# Patient Record
Sex: Male | Born: 1960 | Race: Black or African American | Hispanic: No | Smoking: Current every day smoker
Health system: Southern US, Community
[De-identification: ages and names within clinical notes are randomized; demographics above are authoritative.]

## PROBLEM LIST (undated history)

## (undated) DIAGNOSIS — E785 Hyperlipidemia, unspecified: Secondary | ICD-10-CM

## (undated) DIAGNOSIS — E119 Type 2 diabetes mellitus without complications: Secondary | ICD-10-CM

## (undated) DIAGNOSIS — I10 Essential (primary) hypertension: Secondary | ICD-10-CM

## (undated) HISTORY — DX: Hyperlipidemia, unspecified: E78.5

## (undated) HISTORY — DX: Type 2 diabetes mellitus without complications: E11.9

## (undated) HISTORY — DX: Essential (primary) hypertension: I10

---

## 1998-04-16 ENCOUNTER — Encounter: Admission: RE | Admit: 1998-04-16 | Discharge: 1998-04-16 | Payer: Self-pay | Admitting: Family Medicine

## 1998-05-07 ENCOUNTER — Encounter: Admission: RE | Admit: 1998-05-07 | Discharge: 1998-05-07 | Payer: Self-pay | Admitting: Sports Medicine

## 2004-08-21 ENCOUNTER — Emergency Department (HOSPITAL_COMMUNITY): Admission: EM | Admit: 2004-08-21 | Discharge: 2004-08-21 | Payer: Self-pay | Admitting: Emergency Medicine

## 2012-12-13 ENCOUNTER — Encounter: Payer: BC Managed Care – PPO | Attending: Internal Medicine | Admitting: *Deleted

## 2012-12-13 DIAGNOSIS — Z713 Dietary counseling and surveillance: Secondary | ICD-10-CM | POA: Insufficient documentation

## 2012-12-13 DIAGNOSIS — E119 Type 2 diabetes mellitus without complications: Secondary | ICD-10-CM | POA: Insufficient documentation

## 2012-12-14 ENCOUNTER — Encounter: Payer: Self-pay | Admitting: *Deleted

## 2012-12-14 NOTE — Progress Notes (Signed)
  Patient was seen on 12/13/2012 for the first of a series of three diabetes self-management courses at the [facility].  A1c on 12/06/2012 = 9.4% The following learning objectives were met by the patient during this course:   Defines the role of glucose and insulin  Identifies type of diabetes and pathophysiology  Defines the diagnostic criteria for diabetes and prediabetes  States the risk factors for Type 2 Diabetes  States the symptoms of Type 2 Diabetes  Defines Type 2 Diabetes treatment goals  Defines Type 2 Diabetes treatment options  States the rationale for glucose monitoring  Identifies A1C, glucose targets, and testing times  Identifies proper sharps disposal  Defines the purpose of a diabetes food plan  Identifies carbohydrate food groups  Defines effects of carbohydrate foods on glucose levels  Identifies carbohydrate choices/grams/food labels  States benefits of physical activity and effect on glucose  Review of suggested activity guidelines  Handouts given during class include:  Type 2 Diabetes: Basics Book  My Food Plan Book  Food and Activity Log  Follow-Up Plan: Core Class 2

## 2012-12-14 NOTE — Patient Instructions (Signed)
Goals:  Follow Diabetes Meal Plan as instructed  Eat 3 meals and 2 snacks, every 3-5 hrs  Limit carbohydrate intake to 60 grams carbohydrate/meal  Limit carbohydrate intake to 0-30 grams carbohydrate/snack  Add lean protein foods to meals/snacks  Monitor glucose levels as instructed by your doctor  Aim for 15 mins of physical activity daily as tolerated  Bring food record and glucose log to your next nutrition visit

## 2013-01-05 ENCOUNTER — Encounter: Payer: PRIVATE HEALTH INSURANCE | Attending: Internal Medicine | Admitting: Dietician

## 2013-01-05 DIAGNOSIS — E119 Type 2 diabetes mellitus without complications: Secondary | ICD-10-CM | POA: Insufficient documentation

## 2013-01-05 DIAGNOSIS — Z713 Dietary counseling and surveillance: Secondary | ICD-10-CM | POA: Insufficient documentation

## 2013-01-05 NOTE — Progress Notes (Signed)
  Patient was seen on 01/05/09 for the second of a series of three diabetes self-management courses at the [facility]. The following learning objectives were met by the patient during this course:   Explain basic nutrition maintenance and quality assurance  Describe causes, symptoms and treatment of hypoglycemia and hyperglycemia  Explain how to manage diabetes during illness  Describe the importance of good nutrition for health and healthy eating strategies  List strategies to follow meal plan when dining out  Describe the effects of alcohol on glucose and how to use it safely  Describe problem solving skills for day-to-day glucose challenges  Describe strategies to use when treatment plan needs to change  Identify important factors involved in successful weight loss  Describe ways to remain physically active  Describe the impact of regular activity on insulin resistance  Handouts given in class:  Refrigerator magnet for Sick Day Guidelines  [facility] Oral medication/insulin handout  Weight Loss Handout  Follow-Up Plan: Patient will attend the final class of the ADA Diabetes Self-Care Education.

## 2013-02-16 ENCOUNTER — Encounter: Payer: PRIVATE HEALTH INSURANCE | Attending: Internal Medicine | Admitting: *Deleted

## 2013-02-16 DIAGNOSIS — Z713 Dietary counseling and surveillance: Secondary | ICD-10-CM | POA: Insufficient documentation

## 2013-02-16 DIAGNOSIS — E119 Type 2 diabetes mellitus without complications: Secondary | ICD-10-CM | POA: Insufficient documentation

## 2013-02-16 NOTE — Progress Notes (Signed)
  Patient was seen on 02/16/13 for the third of a series of three diabetes self-management courses at the [facility]. The following learning objectives were met by the patient during this course:    Describe how diabetes changes over time   Identify diabetes complications and ways to prevent them   Describe strategies that can promote heart health including lowering blood pressure and cholesterol   Describe strategies to lower dietary fat and sodium in the diet   Identify physical activities that benefit cardiovascular health   Evaluate success in meeting personal goal   Describe the belief that they can live successfully with diabetes day to day   Establish 2-3 goals that they will plan to diligently work on until they return for the free 69-month follow-up visit  The following handouts were given in class:  3 Month Follow Up Visit handout  Goal setting handout  Class evaluation form  Your patient has established the following 3 month goals for diabetes self-care:  Increase my activity by taking the stairs one day a week  Look for patterns in my record book 1 day a month  Stop smoking by 2015  Follow-Up Plan: Patient will attend a 3 month follow-up visit for diabetes self-management education.

## 2013-02-16 NOTE — Patient Instructions (Signed)
Goals:  Follow Diabetes Meal Plan as instructed  Eat 3 meals and 2 snacks, every 3-5 hrs  Limit carbohydrate intake to 60 grams carbohydrate/meal  Limit carbohydrate intake to 30 grams carbohydrate/snack  Add lean protein foods to meals/snacks  Monitor glucose levels as instructed by your doctor  Aim for 30 mins of physical activity daily  Bring food record and glucose log to your next nutrition visit   

## 2013-04-10 ENCOUNTER — Ambulatory Visit
Admission: RE | Admit: 2013-04-10 | Discharge: 2013-04-10 | Disposition: A | Discharge status: Home / Self Care | Payer: No Typology Code available for payment source | Source: Ambulatory Visit | Attending: Infectious Diseases | Admitting: Infectious Diseases

## 2013-04-10 ENCOUNTER — Other Ambulatory Visit: Payer: Self-pay | Admitting: Infectious Diseases

## 2013-04-10 DIAGNOSIS — R7611 Nonspecific reaction to tuberculin skin test without active tuberculosis: Secondary | ICD-10-CM

## 2017-10-18 ENCOUNTER — Encounter (HOSPITAL_COMMUNITY): Payer: Self-pay | Admitting: Emergency Medicine

## 2017-10-18 ENCOUNTER — Emergency Department (HOSPITAL_COMMUNITY): Payer: 59

## 2017-10-18 ENCOUNTER — Inpatient Hospital Stay (HOSPITAL_COMMUNITY)
Admission: EM | Admit: 2017-10-18 | Discharge: 2017-10-21 | DRG: 054 | Disposition: A | Discharge status: Hospice | Payer: 59 | Attending: Neurosurgery | Admitting: Neurosurgery

## 2017-10-18 ENCOUNTER — Inpatient Hospital Stay (HOSPITAL_COMMUNITY): Payer: 59

## 2017-10-18 DIAGNOSIS — Z7984 Long term (current) use of oral hypoglycemic drugs: Secondary | ICD-10-CM

## 2017-10-18 DIAGNOSIS — F17200 Nicotine dependence, unspecified, uncomplicated: Secondary | ICD-10-CM | POA: Diagnosis present

## 2017-10-18 DIAGNOSIS — G9389 Other specified disorders of brain: Secondary | ICD-10-CM

## 2017-10-18 DIAGNOSIS — E785 Hyperlipidemia, unspecified: Secondary | ICD-10-CM | POA: Diagnosis present

## 2017-10-18 DIAGNOSIS — D496 Neoplasm of unspecified behavior of brain: Secondary | ICD-10-CM | POA: Diagnosis present

## 2017-10-18 DIAGNOSIS — Z79899 Other long term (current) drug therapy: Secondary | ICD-10-CM | POA: Diagnosis not present

## 2017-10-18 DIAGNOSIS — G936 Cerebral edema: Secondary | ICD-10-CM | POA: Diagnosis present

## 2017-10-18 DIAGNOSIS — I10 Essential (primary) hypertension: Secondary | ICD-10-CM | POA: Diagnosis present

## 2017-10-18 DIAGNOSIS — E119 Type 2 diabetes mellitus without complications: Secondary | ICD-10-CM | POA: Diagnosis present

## 2017-10-18 DIAGNOSIS — C718 Malignant neoplasm of overlapping sites of brain: Principal | ICD-10-CM | POA: Diagnosis present

## 2017-10-18 DIAGNOSIS — R414 Neurologic neglect syndrome: Secondary | ICD-10-CM | POA: Diagnosis present

## 2017-10-18 LAB — URINALYSIS, ROUTINE W REFLEX MICROSCOPIC
BACTERIA UA: NONE SEEN
BILIRUBIN URINE: NEGATIVE
Glucose, UA: 150 mg/dL — AB
HGB URINE DIPSTICK: NEGATIVE
KETONES UR: NEGATIVE mg/dL
LEUKOCYTES UA: NEGATIVE
NITRITE: NEGATIVE
Protein, ur: 30 mg/dL — AB
SPECIFIC GRAVITY, URINE: 1.015 (ref 1.005–1.030)
SQUAMOUS EPITHELIAL / LPF: NONE SEEN
pH: 7 (ref 5.0–8.0)

## 2017-10-18 LAB — GLUCOSE, CAPILLARY
GLUCOSE-CAPILLARY: 197 mg/dL — AB (ref 65–99)
Glucose-Capillary: 204 mg/dL — ABNORMAL HIGH (ref 65–99)

## 2017-10-18 LAB — CBC WITH DIFFERENTIAL/PLATELET
Basophils Absolute: 0 10*3/uL (ref 0.0–0.1)
Basophils Relative: 0 %
Eosinophils Absolute: 0 10*3/uL (ref 0.0–0.7)
Eosinophils Relative: 0 %
HCT: 44 % (ref 39.0–52.0)
Hemoglobin: 14.4 g/dL (ref 13.0–17.0)
Lymphocytes Relative: 16 %
Lymphs Abs: 1.8 10*3/uL (ref 0.7–4.0)
MCH: 27 pg (ref 26.0–34.0)
MCHC: 32.7 g/dL (ref 30.0–36.0)
MCV: 82.6 fL (ref 78.0–100.0)
Monocytes Absolute: 0.8 10*3/uL (ref 0.1–1.0)
Monocytes Relative: 7 %
Neutro Abs: 8.5 10*3/uL — ABNORMAL HIGH (ref 1.7–7.7)
Neutrophils Relative %: 77 %
Platelets: 336 10*3/uL (ref 150–400)
RBC: 5.33 MIL/uL (ref 4.22–5.81)
RDW: 14.5 % (ref 11.5–15.5)
WBC: 11.1 10*3/uL — ABNORMAL HIGH (ref 4.0–10.5)

## 2017-10-18 LAB — BASIC METABOLIC PANEL
Anion gap: 8 (ref 5–15)
BUN: 6 mg/dL (ref 6–20)
CALCIUM: 9.2 mg/dL (ref 8.9–10.3)
CHLORIDE: 96 mmol/L — AB (ref 101–111)
CO2: 30 mmol/L (ref 22–32)
CREATININE: 0.76 mg/dL (ref 0.61–1.24)
GFR calc Af Amer: >60 mL/min (ref >60)
GFR calc non Af Amer: >60 mL/min (ref >60)
Glucose, Bld: 203 mg/dL — ABNORMAL HIGH (ref 65–99)
Potassium: 4.8 mmol/L (ref 3.5–5.1)
SODIUM: 134 mmol/L — AB (ref 135–145)

## 2017-10-18 LAB — TROPONIN I: Troponin I: <0.03 ng/mL

## 2017-10-18 LAB — AMMONIA: AMMONIA: 16 umol/L (ref 9–35)

## 2017-10-18 MED ORDER — GADOBENATE DIMEGLUMINE 529 MG/ML IV SOLN
20.0000 mL | Freq: Once | INTRAVENOUS | Status: AC
  Administered 2017-10-18: 20 mL via INTRAVENOUS

## 2017-10-18 MED ORDER — FOLIC ACID 1 MG PO TABS
1.0000 mg | ORAL_TABLET | Freq: Every day | ORAL | Status: DC
  Administered 2017-10-19 – 2017-10-21 (×3): 1 mg via ORAL
  Filled 2017-10-18 (×3): qty 1

## 2017-10-18 MED ORDER — IOPAMIDOL (ISOVUE-300) INJECTION 61%
INTRAVENOUS | Status: AC
  Administered 2017-10-18: 100 mL
  Filled 2017-10-18: qty 100

## 2017-10-18 MED ORDER — LOSARTAN POTASSIUM 50 MG PO TABS
100.0000 mg | ORAL_TABLET | Freq: Every day | ORAL | Status: DC
  Administered 2017-10-19 – 2017-10-21 (×3): 100 mg via ORAL
  Filled 2017-10-18 (×3): qty 2

## 2017-10-18 MED ORDER — INSULIN ASPART 100 UNIT/ML ~~LOC~~ SOLN
0.0000 [IU] | Freq: Three times a day (TID) | SUBCUTANEOUS | Status: DC
  Administered 2017-10-19: 2 [IU] via SUBCUTANEOUS
  Administered 2017-10-19: 3 [IU] via SUBCUTANEOUS
  Administered 2017-10-19: 2 [IU] via SUBCUTANEOUS
  Administered 2017-10-20: 3 [IU] via SUBCUTANEOUS
  Administered 2017-10-20 (×2): 11 [IU] via SUBCUTANEOUS
  Administered 2017-10-21 (×3): 8 [IU] via SUBCUTANEOUS

## 2017-10-18 MED ORDER — LOSARTAN POTASSIUM-HCTZ 100-12.5 MG PO TABS
1.0000 | ORAL_TABLET | Freq: Every day | ORAL | Status: DC
Start: 2017-10-18 — End: 2017-10-18

## 2017-10-18 MED ORDER — ADULT MULTIVITAMIN W/MINERALS CH
1.0000 | ORAL_TABLET | Freq: Every day | ORAL | Status: DC
  Administered 2017-10-19 – 2017-10-21 (×3): 1 via ORAL
  Filled 2017-10-18 (×3): qty 1

## 2017-10-18 MED ORDER — IOPAMIDOL (ISOVUE-300) INJECTION 61%
INTRAVENOUS | Status: AC
  Administered 2017-10-18: 21:00:00
  Filled 2017-10-18: qty 30

## 2017-10-18 MED ORDER — DEXAMETHASONE SODIUM PHOSPHATE 10 MG/ML IJ SOLN
10.0000 mg | Freq: Once | INTRAMUSCULAR | Status: AC
  Administered 2017-10-18: 10 mg via INTRAVENOUS
  Filled 2017-10-18: qty 1

## 2017-10-18 MED ORDER — HYDROCHLOROTHIAZIDE 12.5 MG PO CAPS
12.5000 mg | ORAL_CAPSULE | Freq: Every day | ORAL | Status: DC
  Administered 2017-10-19 – 2017-10-21 (×3): 12.5 mg via ORAL
  Filled 2017-10-18 (×3): qty 1

## 2017-10-18 MED ORDER — SODIUM CHLORIDE 0.9% FLUSH
3.0000 mL | Freq: Two times a day (BID) | INTRAVENOUS | Status: DC
  Administered 2017-10-18 – 2017-10-21 (×6): 3 mL via INTRAVENOUS

## 2017-10-18 MED ORDER — METFORMIN HCL 500 MG PO TABS
500.0000 mg | ORAL_TABLET | Freq: Two times a day (BID) | ORAL | Status: DC
  Administered 2017-10-19 – 2017-10-21 (×6): 500 mg via ORAL
  Filled 2017-10-18 (×6): qty 1

## 2017-10-18 MED ORDER — PRAVASTATIN SODIUM 40 MG PO TABS
80.0000 mg | ORAL_TABLET | Freq: Every day | ORAL | Status: DC
  Administered 2017-10-19 – 2017-10-21 (×3): 80 mg via ORAL
  Filled 2017-10-18 (×3): qty 2

## 2017-10-18 NOTE — ED Triage Notes (Signed)
H/a started last week , feels weaker ,  Daughter states that pts left side is not right and speech was slurred but now it is better  Noticed all this am , pt states fell this am  After getting dizzy

## 2017-10-18 NOTE — H&P (Signed)
Stephen House is an 56 y.o. male.   Chief Complaint: headache HPI: 56 year old male with 1 week history of feeling bad. Patient notes headache and increasing difficulty with his left side. Patient has noted some visual difficulty.Not aware of any definite weakness.Patient with history of tobacco abuse. No known history of malignancy.no history of seizure.  Past Medical History:  Diagnosis Date  . Diabetes mellitus without complication ([place])   . Hyperlipidemia   . Hypertension     History reviewed. No pertinent surgical history.  No family history on file. Social History:  reports that he has been smoking.  He has never used smokeless tobacco. He reports that he does not drink alcohol. His drug history is not on file.  Allergies: No Known Allergies   (Not in a hospital admission)  Results for orders placed or performed during the hospital encounter of 10/18/17 (from the past 48 hour(s))  Ammonia     Status: None   Collection Time: 10/18/17 12:43 PM  Result Value Ref Range   Ammonia 16 9 - 35 umol/L  Basic metabolic panel     Status: Abnormal   Collection Time: 10/18/17 12:52 PM  Result Value Ref Range   Sodium 134 (L) 135 - 145 mmol/L   Potassium 4.8 3.5 - 5.1 mmol/L   Chloride 96 (L) 101 - 111 mmol/L   CO2 30 22 - 32 mmol/L   Glucose, Bld 203 (H) 65 - 99 mg/dL   BUN 6 6 - 20 mg/dL   Creatinine, Ser 0.76 0.61 - 1.24 mg/dL   Calcium 9.2 8.9 - 10.3 mg/dL   GFR calc non Af Amer >60 >60 mL/min   GFR calc Af Amer >60 >60 mL/min    Comment: (NOTE) The eGFR has been calculated using the CKD EPI equation. This calculation has not been validated in all clinical situations. eGFR's persistently <60 mL/min signify possible Chronic Kidney Disease.    Anion gap 8 5 - 15  Troponin I     Status: None   Collection Time: 10/18/17 12:52 PM  Result Value Ref Range   Troponin I <0.03 <0.03 ng/mL  CBC with Differential     Status: Abnormal   Collection Time: 10/18/17 12:52 PM   Result Value Ref Range   WBC 11.1 (H) 4.0 - 10.5 K/uL   RBC 5.33 4.22 - 5.81 MIL/uL   Hemoglobin 14.4 13.0 - 17.0 g/dL   HCT 44.0 39.0 - 52.0 %   MCV 82.6 78.0 - 100.0 fL   MCH 27.0 26.0 - 34.0 pg   MCHC 32.7 30.0 - 36.0 g/dL   RDW 14.5 11.5 - 15.5 %   Platelets 336 150 - 400 K/uL   Neutrophils Relative % 77 %   Neutro Abs 8.5 (H) 1.7 - 7.7 K/uL   Lymphocytes Relative 16 %   Lymphs Abs 1.8 0.7 - 4.0 K/uL   Monocytes Relative 7 %   Monocytes Absolute 0.8 0.1 - 1.0 K/uL   Eosinophils Relative 0 %   Eosinophils Absolute 0.0 0.0 - 0.7 K/uL   Basophils Relative 0 %   Basophils Absolute 0.0 0.0 - 0.1 K/uL   Dg Chest 2 View  Result Date: 10/18/2017 CLINICAL DATA:  Confusion and headache for 3 days. EXAM: CHEST  2 VIEW COMPARISON:  Single-view of the chest 04/10/2013. FINDINGS: The lungs are clear. Heart size is normal. There is no pneumothorax or pleural effusion. No acute bony abnormality. IMPRESSION: Negative chest. Electronically Signed   By: Inge Rise  M.D.   On: 10/18/2017 13:37   Ct Head Wo Contrast  Result Date: 10/18/2017 CLINICAL DATA:  Headache for several days.  Dizziness. EXAM: CT HEAD WITHOUT CONTRAST TECHNIQUE: Contiguous axial images were obtained from the base of the skull through the vertex without intravenous contrast. COMPARISON:  None. FINDINGS: Brain: There is a mass arising in the right occipital low measuring 1.3 x 1.3 cm on this noncontrast enhanced study. This area shows diffuse increased attenuation, suggesting likely hypercellularity. There is widespread vasogenic edema throughout the right occipital and posterior temporal lobes. There is effacement of the occipital horn of the right lateral ventricle. There is shift of the lateral ventricles to the left of midline by 1.5 cm. There is effacement of the left lateral ventricle due to this midline shift and mass effect. There is no evidence suggesting acute infarct. There is no subdural or epidural fluid  collection. No acute hemorrhage evident. Vascular: No appreciable hyperdense vessel. There is mild calcification in the carotid siphon. Skull: The bony calvarium appears intact. Sinuses/Orbits: There is opacification in the posterior right ethmoid air cell complex region in the anterior left ethmoid air cell complex region. There are several areas of ethmoid sinus mucosal thickening elsewhere. There is mild mucosal thickening in the inferolateral left maxillary antrum. Other visualized paranasal sinuses clear. Orbits appear symmetric bilaterally. Other: Mastoid air cells are clear. IMPRESSION: 1. Mass arising in right occipital lobe with extensive vasogenic edema. This vasogenic edema is causing extensive localized mass effect. There is shift of the lateral ventricles 1.5 cm to the left with effacement of the left lateral ventricle. Elsewhere gray-Brigante compartments appear normal on this noncontrast enhanced study. No acute infarct. No acute hemorrhage evident. Increased attenuation in the area of apparent mass is likely due to hypercellularity. MR pre and post-contrast potentially could be helpful for further assessment with respect to degree of mass versus edema and potentially other smaller lesions elsewhere. 2.  Mild arterial vascular calcification. 3.  Foci of paranasal sinus disease noted. These results were called by telephone at the time of interpretation on 10/18/2017 at 1:22 pm to Dr. Zenovia Jarred , who verbally acknowledged these results. Electronically Signed   By: Lowella Grip III M.D.   On: 10/18/2017 13:22    Pertinent items noted in HPI and remainder of comprehensive ROS otherwise negative.  Blood pressure (!) 167/87, pulse 75, resp. rate (!) 23, SpO2 100 %.  The patient is awake and aware. He is oriented to person and time and place. His speech is fluent but slow. Moderate hemi-neglect on left. Cranial nerve function with left-sided visual field deficit.patient with slight left  facial weakness.motor examination 5/5 bilaterally with the aforementioned neglect.Sensory examination with decrease stereognosis. Examination of HEENT unremarkable. Neck with midline airway without masses.Chest and abdomen benign. Extremities free from injury or deformity. 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 Marked vasogenic edema. Plan admission to hospital with administration of IV steroids. Plan MRI scan brain and CT scan chest and abdomen for further evaluation.  Roddrick Sharron A 10/18/2017, 2:19 PM

## 2017-10-18 NOTE — ED Provider Notes (Signed)
[facility] EMERGENCY DEPARTMENT Provider Note   CSN: [id number] Arrival date & time: 10/18/17  1125     History   Chief Complaint Chief Complaint  Patient presents with  . Headache    HPI Stephen House is a 56 y.o. male.  HPI   56 year old male with PMH of HTN, hyperlipidemia, and T2DM presents with headaches x2 weeks. Headache began gradually and has been low grade. Present over right temporal region. Has noted intermittent slurred speech x1 week as well as left sided weakness. Daughter at bedside reports her father called her at 10:30 this morning requesting to go to the hospital. When she arrived, he seemed confused and was slurring his words. With standing she noticed that he would lean to the left side. No prior history of CVA or MI. Not on anticoagulation. Reports he does not take statin medication or baby ASA.   Past Medical History:  Diagnosis Date  . Diabetes mellitus without complication ([place])   . Hyperlipidemia   . Hypertension     There are no active problems to display for this patient.   History reviewed. No pertinent surgical history.     Home Medications    Prior to Admission medications   Medication Sig Start Date End Date Taking? Authorizing Provider  metFORMIN (GLUCOPHAGE) 500 MG tablet Take 500 mg by mouth 2 (two) times daily with a meal.   Yes [provider]  losartan-hydrochlorothiazide (HYZAAR) 100-12.5 MG per tablet Take 1 tablet by mouth daily.    [provider]  ONE TOUCH LANCETS MISC by Does not apply route.    [provider]  pravastatin (PRAVACHOL) 80 MG tablet Take 80 mg by mouth daily.    [provider]    Family History No family history on file.  Social History Social History  Substance Use Topics  . Smoking status: Current Every Day Smoker  . Smokeless tobacco: Never Used  . Alcohol use No     Allergies   Patient has no known allergies.   Review of  Systems Review of Systems  Constitutional: Negative for chills and fever.  HENT: Negative for congestion and trouble swallowing.   Eyes: Negative for visual disturbance.  Respiratory: Negative for chest tightness and shortness of breath.   Cardiovascular: Negative for chest pain.  Gastrointestinal: Negative for abdominal pain, nausea and vomiting.  Neurological: Positive for speech difficulty, weakness and headaches. Negative for facial asymmetry and numbness.  All other systems reviewed and are negative.    Physical Exam Updated Vital Signs BP (!) 167/87   Pulse 75   Resp (!) 23   SpO2 100%   Physical Exam  Constitutional: He is oriented to person, place, and time. He appears well-developed and well-nourished. No distress.  HENT:  Head: Normocephalic and atraumatic.  Mouth/Throat: Oropharynx is clear and moist.  Eyes: Pupils are equal, round, and reactive to light. Conjunctivae and EOM are normal.  Diminished left peripheral vision.   Neck: Normal range of motion. Neck supple.  Cardiovascular: Normal rate, regular rhythm, normal heart sounds and intact distal pulses.   No murmur heard. Pulmonary/Chest: Effort normal and breath sounds normal. No respiratory distress. He has no wheezes.  Abdominal: Soft. Bowel sounds are normal. He exhibits no distension. There is no tenderness.  Musculoskeletal: Normal range of motion. He exhibits no deformity.  Neurological: He is alert and oriented to person, place, and time. No cranial nerve deficit.  Sensation to UE and LE grossly  intact. Strength 5/5 in UE bilaterally. Strength 4+/5 in left LE; 5/5 in right LE. With RN, left sided finger to nose diminished and heel to shin slowed. Normal repeat testing. Slowed speech that is occasionally slurred.   Skin: Skin is warm and dry.  Psychiatric: He has a normal mood and affect. Thought content normal.     ED Treatments / Results  Labs (all labs ordered are listed, but only abnormal results  are displayed) Labs Reviewed  BASIC METABOLIC PANEL - Abnormal; Notable for the following:       Result Value   Sodium 134 (*)    Chloride 96 (*)    Glucose, Bld 203 (*)    All other components within normal limits  CBC WITH DIFFERENTIAL/PLATELET - Abnormal; Notable for the following:    WBC 11.1 (*)    Neutro Abs 8.5 (*)    All other components within normal limits  TROPONIN I  AMMONIA  URINALYSIS, ROUTINE W REFLEX MICROSCOPIC    EKG  EKG Interpretation None       Radiology Dg Chest 2 View  Result Date: 10/18/2017 CLINICAL DATA:  Confusion and headache for 3 days. EXAM: CHEST  2 VIEW COMPARISON:  Single-view of the chest 04/10/2013. FINDINGS: The lungs are clear. Heart size is normal. There is no pneumothorax or pleural effusion. No acute bony abnormality. IMPRESSION: Negative chest. Electronically Signed   By: Inge Rise M.D.   On: 10/18/2017 13:37   Ct Head Wo Contrast  Result Date: 10/18/2017 CLINICAL DATA:  Headache for several days.  Dizziness. EXAM: CT HEAD WITHOUT CONTRAST TECHNIQUE: Contiguous axial images were obtained from the base of the skull through the vertex without intravenous contrast. COMPARISON:  None. FINDINGS: Brain: There is a mass arising in the right occipital low measuring 1.3 x 1.3 cm on this noncontrast enhanced study. This area shows diffuse increased attenuation, suggesting likely hypercellularity. There is widespread vasogenic edema throughout the right occipital and posterior temporal lobes. There is effacement of the occipital horn of the right lateral ventricle. There is shift of the lateral ventricles to the left of midline by 1.5 cm. There is effacement of the left lateral ventricle due to this midline shift and mass effect. There is no evidence suggesting acute infarct. There is no subdural or epidural fluid collection. No acute hemorrhage evident. Vascular: No appreciable hyperdense vessel. There is mild calcification in the carotid siphon.  Skull: The bony calvarium appears intact. Sinuses/Orbits: There is opacification in the posterior right ethmoid air cell complex region in the anterior left ethmoid air cell complex region. There are several areas of ethmoid sinus mucosal thickening elsewhere. There is mild mucosal thickening in the inferolateral left maxillary antrum. Other visualized paranasal sinuses clear. Orbits appear symmetric bilaterally. Other: Mastoid air cells are clear. IMPRESSION: 1. Mass arising in right occipital lobe with extensive vasogenic edema. This vasogenic edema is causing extensive localized mass effect. There is shift of the lateral ventricles 1.5 cm to the left with effacement of the left lateral ventricle. Elsewhere gray-Oloughlin compartments appear normal on this noncontrast enhanced study. No acute infarct. No acute hemorrhage evident. Increased attenuation in the area of apparent mass is likely due to hypercellularity. MR pre and post-contrast potentially could be helpful for further assessment with respect to degree of mass versus edema and potentially other smaller lesions elsewhere. 2.  Mild arterial vascular calcification. 3.  Foci of paranasal sinus disease noted. These results were called by telephone at the time of interpretation on  10/18/2017 at 1:22 pm to Dr. Zenovia Jarred , who verbally acknowledged these results. Electronically Signed   By: Lowella Grip III M.D.   On: 10/18/2017 13:22    Procedures Procedures (including critical care time)  Medications Ordered in ED Medications  dexamethasone (DECADRON) injection 10 mg (not administered)     Initial Impression / Assessment and Plan / ED Course  I have reviewed the triage vital signs and the nursing notes.  Pertinent labs & imaging results that were available during my care of the patient were reviewed by me and considered in my medical decision making (see chart for details).    56 year old male presenting with 2 week history and 1  week history of intermittent slurred speech, confusion and left sided weakness. Vital signs notable for BP 171/90 (patient reports taking medications this AM). Physical exam notable for observed diminished left sided finger to nose and heel to shin as well as diminished left sided peripheral vision.   Will obtain CBC and BMET. CT head ordered. UA, ammonia, and CXR ordered given report of low level confusion. EKG and troponin ordered.   Troponin negative. BMET with glucose 203. CBC with mild leukocytosis to 11. Ammonia WNL.   Unfortunately, CT head revealed a mass arising in right occipital lobe with extensive vasogenic edema. Edema causing extensive localized mass effect. Shift of the lateral ventricles 1.5 cm to the left with effacement of left lateral ventricle. No acute infarct or hemorrhage evident.  Neurosurgery consult placed.  Per recommendations, Decadron 10 mg given. Patient will need MRI brain. Consulted IMTS for admission for further evaluation and management. Discussed with Stephen House who accepted admission.   Final Clinical Impressions(s) / ED Diagnoses   Final diagnoses:  Brain mass    New Prescriptions New Prescriptions   No medications on file     Nicolette Bang, DO 10/18/17 [place], McRoberts, DO 10/18/17 1419    [place], Fredia Sorrow, MD 10/18/17 1620

## 2017-10-18 NOTE — ED Notes (Signed)
Attempted to call report

## 2017-10-19 LAB — GLUCOSE, CAPILLARY
GLUCOSE-CAPILLARY: 157 mg/dL — AB (ref 65–99)
GLUCOSE-CAPILLARY: 132 mg/dL — AB (ref 65–99)
GLUCOSE-CAPILLARY: 148 mg/dL — AB (ref 65–99)
GLUCOSE-CAPILLARY: 312 mg/dL — AB (ref 65–99)

## 2017-10-19 LAB — HIV ANTIBODY (ROUTINE TESTING W REFLEX): HIV SCREEN 4TH GENERATION: NONREACTIVE

## 2017-10-19 MED ORDER — DEXAMETHASONE SODIUM PHOSPHATE 10 MG/ML IJ SOLN
10.0000 mg | Freq: Four times a day (QID) | INTRAMUSCULAR | Status: DC
  Administered 2017-10-19 – 2017-10-20 (×5): 10 mg via INTRAVENOUS
  Filled 2017-10-19 (×5): qty 1

## 2017-10-19 MED ORDER — ACETAMINOPHEN 500 MG PO TABS
1000.0000 mg | ORAL_TABLET | Freq: Four times a day (QID) | ORAL | Status: DC | PRN
  Administered 2017-10-19 – 2017-10-21 (×3): 1000 mg via ORAL
  Filled 2017-10-19 (×3): qty 2

## 2017-10-19 NOTE — Progress Notes (Signed)
Patient states that his headaches are better today.  He is afebrile.  His vitals are stable.  He is awake and alert.  He still has some mild left-sided hemi-neglect and a dense left visual field cut.  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  The tumor has invaded the splenium of his corpus callosum and extends into his left side.  There is also Pantoja matter track involvement on the right side extending into his mesial temporal lobe.  There is extensive edema.  Imaging essentially pathognomonic for glioblastoma.  Tumor cannot be resected any meaningful way given its extension.  Although I have discussed possible craniotomy and open biopsy of this lesion with the patient I do not think that his overall prognosis would materially benefit from extensive treatment.  I have discussed the possibility of pursuing surgery radiation treatment and oral chemotherapy verses more of a hospice type path.  The patient and his daughter are considering options.  Plan to get physical therapy and occupational therapy involved.  I would also like to get a hospice consult.  Plan to discharge home on oral steroids over the next couple days depending on his activity level.  I will have him come back in my office next week to discuss possible surgery further although at this time the patient and his daughter do not seem to be leaning toward surgery.

## 2017-10-19 NOTE — Care Management Note (Addendum)
Case Management Note  Patient Details  Name: Stephen House MRN: [id number] Date of Birth: 01/14/1961  Subjective/Objective:      Pt admitted with brain tumor. He has been living in a hotel.               Action/Plan: Plan is for d/c home when medically stable. CM following for d/c needs, physician orders.   Expected Discharge Date:                  Expected Discharge Plan:     In-House Referral:     Discharge planning Services     Post Acute Care Choice:    Choice offered to:     DME Arranged:    DME Agency:     HH Arranged:    HH Agency:     Status of Service:  In process, will continue to follow  If discussed at Long Length of Stay Meetings, dates discussed:    Additional Comments:  Pollie Friar, RN 10/19/2017, 1:29 PM

## 2017-10-19 NOTE — Progress Notes (Signed)
Patient states that he has a headache.  Rates the pain as 8 out of 0-10 scale   RN contacted provider on call - New orders received .

## 2017-10-20 LAB — GLUCOSE, CAPILLARY
GLUCOSE-CAPILLARY: 321 mg/dL — AB (ref 65–99)
Glucose-Capillary: 291 mg/dL — ABNORMAL HIGH (ref 65–99)
Glucose-Capillary: 327 mg/dL — ABNORMAL HIGH (ref 65–99)
Glucose-Capillary: 343 mg/dL — ABNORMAL HIGH (ref 65–99)

## 2017-10-20 MED ORDER — DEXAMETHASONE SODIUM PHOSPHATE 4 MG/ML IJ SOLN
4.0000 mg | Freq: Four times a day (QID) | INTRAMUSCULAR | Status: DC
  Administered 2017-10-21 (×3): 4 mg via INTRAVENOUS
  Filled 2017-10-20 (×3): qty 1

## 2017-10-20 NOTE — Evaluation (Signed)
Physical Therapy Evaluation Patient Details Name: Stephen House MRN: [id number] DOB: June 08, 1961 Today's Date: 10/20/2017   History of Present Illness  Pt is a 56 y/o male admitted secondary to headache, L sided weakness and visual deficits. 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 Per Neurosurgery they could potentially do a biopsy but no meaningful surgical treatment for pt. PMH including but not limited to HTN and DM.  Clinical Impression  Pt presented supine in bed with HOB elevated, awake and willing to participate in therapy session. Prior to admission, pt reported that he was independent with all functional mobility and ADLs. Pt reported that he currently lives in a motel. Pt does have a daughter who called during evaluation, but pt unsure how much assistance she could provide or if it was an option to stay with her. Pt ambulated a short distance in the hallway with use of RW and min guard for safety. Pt with no overt LOB; however, pt reported that he had to maintain downward visual gaze to avoid dizziness. Pt would continue to benefit from skilled physical therapy services at this time while admitted and after d/c to address the below listed limitations in order to improve overall safety and independence with functional mobility.  Will continue to follow acutely to assist with d/c planning and await Hospice and/or Palliative Consult.     Follow Up Recommendations Home health PT;Other (comment) (HHOT, [place] aide or ?home with Hospice)    Equipment Recommendations  Rolling walker with 5" wheels    Recommendations for Other Services       Precautions / Restrictions Restrictions Weight Bearing Restrictions: No      Mobility  Bed Mobility Overal bed mobility: Needs Assistance Bed Mobility: Supine to Sit;Sit to Supine     Supine to sit: Supervision Sit to supine: Supervision   General bed mobility comments: increased time, supervision for safety  Transfers Overall  transfer level: Needs assistance Equipment used: Rolling walker (2 wheeled) Transfers: Sit to/from Stand Sit to Stand: Min guard         General transfer comment: increased time, pt pulling on RW with both hands, min guard for safety  Ambulation/Gait Ambulation/Gait assistance: Min guard Ambulation Distance (Feet): 75 Feet Assistive device: Rolling walker (2 wheeled) Gait Pattern/deviations: Step-through pattern;Decreased stride length Gait velocity: decreased Gait velocity interpretation: Below normal speed for age/gender General Gait Details: pt maintaining visual gaze down at the floor, reporting that if he looks forwards he gets very dizzy. Pt with mild instability throughout but no overt LOB or need for physical assistance, min guard for safety. pt managing RW safely and appropriately  Stairs            Wheelchair Mobility    Modified Rankin (Stroke Patients Only)       Balance Overall balance assessment: Needs assistance Sitting-balance support: Feet supported Sitting balance-Leahy Scale: Good Sitting balance - Comments: pt able to don socks at EOB with min guard for safety; pt reporting that he feels like he is going to fall posteriorly   Standing balance support: During functional activity;Bilateral upper extremity supported Standing balance-Leahy Scale: Poor Standing balance comment: reliant on RW for dynamic balance                             Pertinent Vitals/Pain Pain Assessment: 0-10 Pain Score: 5  Pain Location: headache Pain Descriptors / Indicators: Headache;Throbbing Pain Intervention(s): Monitored during session;Repositioned    Home  Living Family/patient expects to be discharged to:: Unsure                 Additional Comments: pt reported that he lives in a motel. pt's daughter called during evaluation and therapist asked if it was possible for pt to stay with his daughter upon d/c but he said he was not sure.    Prior  Function Level of Independence: Independent               Hand Dominance   Dominant Hand: Right    Extremity/Trunk Assessment   Upper Extremity Assessment Upper Extremity Assessment: Defer to OT evaluation    Lower Extremity Assessment Lower Extremity Assessment: Generalized weakness    Cervical / Trunk Assessment Cervical / Trunk Assessment: Normal  Communication   Communication: No difficulties  Cognition Arousal/Alertness: Awake/alert Behavior During Therapy: WFL for tasks assessed/performed Overall Cognitive Status: No family/caregiver present to determine baseline cognitive functioning Area of Impairment: Problem solving;Following commands                       Following Commands: Follows one step commands consistently;Follows one step commands with increased time;Follows multi-step commands inconsistently     Problem Solving: Difficulty sequencing;Requires verbal cues;Requires tactile cues        General Comments      Exercises     Assessment/Plan    PT Assessment Patient needs continued PT services  PT Problem List Decreased strength;Decreased balance;Decreased mobility;Decreased coordination;Decreased knowledge of use of DME;Decreased safety awareness;Decreased cognition;Pain       PT Treatment Interventions DME instruction;Gait training;Stair training;Functional mobility training;Therapeutic activities;Therapeutic exercise;Balance training;Cognitive remediation;Neuromuscular re-education;Patient/family education    PT Goals (Current goals can be found in the Care Plan section)  Acute Rehab PT Goals Patient Stated Goal: to get a walker before d/c PT Goal Formulation: With patient Time For Goal Achievement: 11/03/17 Potential to Achieve Goals: Fair    Frequency Min 3X/week   Barriers to discharge Decreased caregiver support      Co-evaluation               AM-PAC PT "6 Clicks" Daily Activity  Outcome Measure Difficulty  turning over in bed (including adjusting bedclothes, sheets and blankets)?: None Difficulty moving from lying on back to sitting on the side of the bed? : None Difficulty sitting down on and standing up from a chair with arms (e.g., wheelchair, bedside commode, etc,.)?: Unable Help needed moving to and from a bed to chair (including a wheelchair)?: A Little Help needed walking in hospital room?: A Little Help needed climbing 3-5 steps with a railing? : A Lot 6 Click Score: 17    End of Session Equipment Utilized During Treatment: Gait belt Activity Tolerance: Patient tolerated treatment well Patient left: in bed;with call bell/phone within reach Nurse Communication: Mobility status PT Visit Diagnosis: Unsteadiness on feet (R26.81);Other abnormalities of gait and mobility (R26.89)    Time: 2130-8657 PT Time Calculation (min) (ACUTE ONLY): 17 min   Charges:   PT Evaluation $PT Eval Moderate Complexity: 1 Mod     PT G Codes:        [place], PT, DPT [place] 10/20/2017, 10:00 AM

## 2017-10-20 NOTE — Evaluation (Signed)
Occupational Therapy Evaluation Patient Details Name: Stephen House MRN: [id number] DOB: November 07, 1961 Today's Date: 10/20/2017    History of Present Illness Pt is a 56 y/o male admitted secondary to headache, L sided weakness and visual deficits. 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 Per Neurosurgery they could potentially do a biopsy but no meaningful surgical treatment for pt. PMH including but not limited to HTN and DM.   Clinical Impression   PTA, pt was living alone and performing with ADLs, IADLs, and functional mobility. Pt currently performing LB ADLs and ADLs in standing with Min Guard A for safety. Pt presenting with deficits to L visual field as seen during vision testing and functional tasks. Pt also presenting with decreased cognition and awareness of deficits. Pt would benefit from further acute OT to address vision deficits and increase safety and independence with ADLs. Pt unsure of dc plan. Recommend DC home with HHOT to optimize safety and independence with ADLs and IADLs in home environment.     Follow Up Recommendations  Home health OT;Supervision - Intermittent    Equipment Recommendations  3 in 1 bedside commode    Recommendations for Other Services PT consult     Precautions / Restrictions Precautions Precautions: Fall Restrictions Weight Bearing Restrictions: No      Mobility Bed Mobility Overal bed mobility: Needs Assistance Bed Mobility: Supine to Sit;Sit to Supine     Supine to sit: Supervision Sit to supine: Supervision   General bed mobility comments: increased time, supervision for safety  Transfers Overall transfer level: Needs assistance Equipment used: Rolling walker (2 wheeled) Transfers: Sit to/from Stand Sit to Stand: Min guard         General transfer comment: increased time, pt pulling on RW with both hands, min guard for safety    Balance Overall balance assessment: Needs assistance Sitting-balance support: Feet  supported Sitting balance-Leahy Scale: Good Sitting balance - Comments: pt able to don socks at EOB with min guard for safety; pt reporting that he feels like he is going to fall posteriorly   Standing balance support: During functional activity;Bilateral upper extremity supported Standing balance-Leahy Scale: Poor Standing balance comment: reliant on RW for dynamic balance                           ADL either performed or assessed with clinical judgement   ADL Overall ADL's : Needs assistance/impaired Eating/Feeding: Set up;Sitting   Grooming: Oral care;Wash/dry face;Min guard;Standing;Cueing for sequencing Grooming Details (indicate cue type and reason): Required VCs to locate items (specifically on L side) Pt would put items on r side of sink once done. Pt requiring Min VCs for sequencing of tasks. Upper Body Bathing: Set up;Supervision/ safety;Sitting   Lower Body Bathing: Min guard;Sit to/from stand   Upper Body Dressing : Set up;Supervision/safety;Sitting   Lower Body Dressing: Min guard;Sit to/from stand Lower Body Dressing Details (indicate cue type and reason): Adjust socks without difficulty Toilet Transfer: Min guard;Ambulation (Simulated in room)           Functional mobility during ADLs: Min guard;Rolling walker General ADL Comments: Pt dmeosntrating poor vision with dificits to L visual field impacting his functional performance. Also requiring increased time to complete ADLs.      Vision Baseline Vision/History: No visual deficits Patient Visual Report: Other (comment) (Does not report difficulty) Vision Assessment?: Yes;Vision impaired- to be further tested in functional context Eye Alignment: Within Functional Limits Tracking/Visual Pursuits: Decreased smoothness  of horizontal tracking;Decreased smoothness of vertical tracking Convergence: Impaired (comment) (R eye did not converge) Visual Fields: Left visual field deficit;Impaired-to be further  tested in functional context Additional Comments: Pt must turn head to L to locate object in L visual field. Pt unable to locate objects until at midline when coming from L side. Pt dmeonstrating decreased visual attention and unabelto maintain smooth tracking     Perception     Praxis      Pertinent Vitals/Pain Pain Assessment: No/denies pain Pain Score: 5  Pain Location: headache Pain Descriptors / Indicators: Headache;Throbbing Pain Intervention(s): Monitored during session     Hand Dominance  (Both "I am awkward handed")   Extremity/Trunk Assessment Upper Extremity Assessment Upper Extremity Assessment: LUE deficits/detail LUE Deficits / Details: Decreased coordination  LUE Coordination: decreased gross motor   Lower Extremity Assessment Lower Extremity Assessment: Generalized weakness   Cervical / Trunk Assessment Cervical / Trunk Assessment: Normal   Communication Communication Communication: No difficulties   Cognition Arousal/Alertness: Awake/alert Behavior During Therapy: WFL for tasks assessed/performed Overall Cognitive Status: No family/caregiver present to determine baseline cognitive functioning Area of Impairment: Following commands;Problem solving;Awareness;Attention                   Current Attention Level: Selective (Pt reporting difficulty performing tasks with distractions)   Following Commands: Follows one step commands consistently;Follows one step commands with increased time;Follows multi-step commands inconsistently   Awareness: Emergent Problem Solving: Difficulty sequencing;Requires verbal cues;Requires tactile cues;Slow processing General Comments: Pt demonstrating decreaed cognittion and required increased time to complete ADL tasks. Pt reporting difficulty with performign tasks while in a distracting environement and demonstrating increased effort to compelte grooming while answering OT questions.  Decreased awareness of deficits    General Comments       Exercises     Shoulder Instructions      Home Living Family/patient expects to be discharged to:: Unsure Living Arrangements: Children                 Bathroom Shower/Tub: Occupational psychologist: Standard         Additional Comments: pt reported that he lives in a motel. pt's daughter called during evaluation and therapist asked if it was possible for pt to stay with his daughter upon d/c but he said he was not sure.      Prior Functioning/Environment Level of Independence: Independent                 OT Problem List: Decreased range of motion;Impaired balance (sitting and/or standing);Impaired vision/perception;Decreased coordination;Decreased cognition;Decreased safety awareness;Decreased knowledge of use of DME or AE;Pain      OT Treatment/Interventions: Self-care/ADL training;Therapeutic exercise;Energy conservation;DME and/or AE instruction;Therapeutic activities;Patient/family education    OT Goals(Current goals can be found in the care plan section) Acute Rehab OT Goals Patient Stated Goal: Get better OT Goal Formulation: With patient Time For Goal Achievement: 11/03/17 Potential to Achieve Goals: Good ADL Goals Pt Will Perform Grooming: with modified independence;standing (attending to L side 50% of time) Pt Will Perform Upper Body Dressing: with modified independence;sitting Pt Will Perform Lower Body Dressing: with modified independence;sit to/from stand Pt Will Transfer to Toilet: with modified independence;ambulating;regular height toilet Additional ADL Goal #1: Pt will use compensatory vision strategies during ADLs with Min VCs  OT Frequency: Min 3X/week   Barriers to D/C: Decreased caregiver support  Unsure of support at dc       Co-evaluation  AM-PAC PT "6 Clicks" Daily Activity     Outcome Measure Help from another person eating meals?: None Help from another person taking care of  personal grooming?: A Little Help from another person toileting, which includes using toliet, bedpan, or urinal?: A Little Help from another person bathing (including washing, rinsing, drying)?: A Little Help from another person to put on and taking off regular upper body clothing?: A Little Help from another person to put on and taking off regular lower body clothing?: A Little 6 Click Score: 19   End of Session Equipment Utilized During Treatment: Rolling walker Nurse Communication: Mobility status  Activity Tolerance: Patient tolerated treatment well Patient left: in bed;with call bell/phone within reach;with bed alarm set  OT Visit Diagnosis: Unsteadiness on feet (R26.81);Other abnormalities of gait and mobility (R26.89);Muscle weakness (generalized) (M62.81);Low vision, both eyes (H54.2);Other symptoms and signs involving cognitive function                Time: 8110-3159 OT Time Calculation (min): 18 min Charges:  OT General Charges $OT Visit: 1 Visit OT Evaluation $OT Eval Moderate Complexity: 1 Mod G-Codes:     [place] MSOT, OTR/L Acute Rehab Pager: [phone] Office: [place] 10/20/2017, 11:14 AM

## 2017-10-20 NOTE — Progress Notes (Signed)
Overall patient somewhat better.  He notes less headache.  His left-sided neglect is better.  He was able to participate with physical therapy.  Patient has a large aggressive right parietal occipital primary brain tumor which is almost certainly a glioblastoma.  The tumor invades deep Seldon matter tracts and extends into his hippocampus on the right side and crosses into his left hemisphere through the splenium of the corpus callosum.  With that in mind there is no meaningful resection that can be achieved through surgery.  Certainly adjunctive measures such as radiation therapy and or chemotherapy may slow this process down somewhat.  The patient's social situation is poor.  He has no current place to live.  He has a daughter who he is close with and does help him some.  He is not sure that he wishes to proceed with surgery at this point.  I cannot argue with that as I do not think that surgery holes any great promise for improvement of his quality in his remaining life.  At this point I would like to get hospice care involved as well as social Work involved with regard to discharge planning and further care.  Whether that means that palate of care medicine needs to become involved I do not know.

## 2017-10-20 NOTE — Progress Notes (Signed)
RN left a message on Dr. Annette Stable answering service regarding patient's wife wanting to speak to MD regarding patients prognosis and hospice transfer.

## 2017-10-20 NOTE — Progress Notes (Signed)
CM met with the patient and his family. Patient states he has been living in a hotel. CM inquired about assistance at d/c. Patients wife states she will be providing support at discharge. Recommendations are for walker and 3 in 1 at d/c. CM will notify [facility] in am to get process started.  Family requesting to see Dr Annette Stable. Bedside RN updated. CM will continue to follow.

## 2017-10-20 NOTE — Progress Notes (Signed)
Inpatient Diabetes Program Recommendations  AACE/ADA: New Consensus Statement on Inpatient Glycemic Control (2015)  Target Ranges:  Prepandial:   less than 140 mg/dL      Peak postprandial:   less than 180 mg/dL (1-2 hours)      Critically ill patients:  140 - 180 mg/dL   Results for Stephen House, Stephen House (MRN [id number]) as of 10/20/2017 11:50  Ref. Range 10/19/2017 16:24 10/19/2017 21:03 10/20/2017 06:35 10/20/2017 11:47  Glucose-Capillary Latest Ref Range: 65 - 99 mg/dL 148 (H) 312 (H) 291 (H) 327 (H)   Inpatient Diabetes Program Recommendations:     Patient on Decadron 10 mg Q 6 hours, Glucose increased into the 200-300 range. Consider low dose basal insulin while on steroids, Lantus 12 units Q 24 hours.  Thanks,  Tama Headings RN, MSN, [facility] Inpatient Diabetes Coordinator Team Pager [phone] (8a-5p)

## 2017-10-21 LAB — GLUCOSE, CAPILLARY
Glucose-Capillary: 256 mg/dL — ABNORMAL HIGH (ref 65–99)
Glucose-Capillary: 275 mg/dL — ABNORMAL HIGH (ref 65–99)
Glucose-Capillary: 289 mg/dL — ABNORMAL HIGH (ref 65–99)

## 2017-10-21 MED ORDER — DEXAMETHASONE 4 MG PO TABS: 4.0000 mg | ORAL_TABLET | Freq: Three times a day (TID) | ORAL | 0 refills | Status: AC

## 2017-10-21 MED ORDER — DEXAMETHASONE 4 MG PO TABS
4.0000 mg | ORAL_TABLET | Freq: Three times a day (TID) | ORAL | Status: DC
  Administered 2017-10-21: 4 mg via ORAL
  Filled 2017-10-21: qty 1

## 2017-10-21 NOTE — Progress Notes (Signed)
Chaplain Note:  Requested assistance in completing Living Will and [place].  I reviewed the document with the patient and his wife and he expressed understanding and his wish to complete. I secured Notary and Witnesses. Once the form was completed I gave a copy to Nursing Sec. To put in chart and provided original and three copies to patient's wife.   Sue Lush

## 2017-10-21 NOTE — Progress Notes (Signed)
Pt discharging to [employer] with ex wife's assistance. CM inquired about home hospice and they selected [facility]. Bevely Palmer with [place] notified and they will follow up with patients ex wife.  Bedside RN updated and will include DNR form in his d/c packet.  Ex wife to provide transportation to [employer].

## 2017-10-21 NOTE — Progress Notes (Signed)
PT Cancellation Note  Patient Details Name: Stephen House MRN: [id number] DOB: 08/20/1961   Cancelled Treatment:    Reason Eval/Treat Not Completed: Patient declined, no reason specified. Pt's wife present and declining therapy at this time. She reported that she had just recently assisted the pt back to bed after having him sit up in a chair this morning. PT will continue to f/u with pt as available.   [place] 10/21/2017, 12:18 PM

## 2017-10-21 NOTE — Discharge Summary (Signed)
Physician Discharge Summary  Patient ID: Stephen House MRN: [id number] DOB/AGE: 01/22/1961 56 y.o.  Admit date: 10/18/2017 Discharge date: 10/21/2017  Admission Diagnoses:right brain tumor  Discharge Diagnoses: the same Active Problems:   Brain tumor [facility])   Discharged Condition: fair  Hospital Course: the patient was admitted on 10/18/2017 with a right brain tumor.the patient was started on steroids and improved.The situation was discussedwith the patient and his wife. They elected for palliative care. They requested discharge home on 1025 and 18. They were given discharge instructions and their questions were answered. They were instructed to follow-up with Dr. Annette Stable next week.  Consults:none Significant Diagnostic Studies:brain MRI Treatments:medications Discharge Exam: Blood pressure (!) 141/67, pulse 92, temperature 98.1 F (36.7 C), temperature source Oral, resp. rate 20, SpO2 100 %. The patient is alert and pleasant. He is moving all 4 extremities.  Disposition: home with his wife.  Discharge Instructions    Call MD for:  difficulty breathing, headache or visual disturbances    Complete by:  As directed    Call MD for:  extreme fatigue    Complete by:  As directed    Call MD for:  hives    Complete by:  As directed    Call MD for:  persistant dizziness or light-headedness    Complete by:  As directed    Call MD for:  persistant nausea and vomiting    Complete by:  As directed    Call MD for:  redness, tenderness, or signs of infection (pain, swelling, redness, odor or green/yellow discharge around incision site)    Complete by:  As directed    Call MD for:  severe uncontrolled pain    Complete by:  As directed    Call MD for:  temperature >100.4    Complete by:  As directed    Diet - low sodium heart healthy    Complete by:  As directed    Discharge instructions    Complete by:  As directed    Call [phone] for a followup appointment. Take a stool  softener while you are using pain medications.   Driving Restrictions    Complete by:  As directed    Do not drive for 2 weeks.   Increase activity slowly    Complete by:  As directed    Lifting restrictions    Complete by:  As directed    Do not lift more than 5 pounds. No excessive bending or twisting.   May shower / Bathe    Complete by:  As directed      Allergies as of 10/21/2017   No Known Allergies     Medication List    TAKE these medications   dexamethasone 4 MG tablet Commonly known as:  DECADRON Take 1 tablet (4 mg total) by mouth every 8 (eight) hours.   losartan-hydrochlorothiazide 100-12.5 MG tablet Commonly known as:  HYZAAR Take 1 tablet by mouth daily.   metFORMIN 500 MG tablet Commonly known as:  GLUCOPHAGE Take 500 mg by mouth 2 (two) times daily with a meal.   ONE TOUCH LANCETS Misc by Does not apply route.   pravastatin 80 MG tablet Commonly known as:  PRAVACHOL Take 80 mg by mouth daily.            Durable Medical Equipment        Start     Ordered   10/20/17 2004  For home use only DME 3 n 1  Once  10/20/17 2003   10/20/17 2004  For home use only DME Walker rolling  Once    Question:  Patient needs a walker to treat with the following condition  Answer:  Brain tumor [facility])   10/20/17 2003     Follow-up Information    Earnie Larsson, MD. Schedule an appointment as soon as possible for a visit in 1 week(s).   Specialty:  Neurosurgery Contact information: [address] [place] [address] [phone]           Signed: Ophelia Charter 10/21/2017, 4:37 PM

## 2017-10-21 NOTE — Care Management Note (Signed)
Case Management Note  Patient Details  Name: Stephen House MRN: [id number] Date of Birth: 1961/06/05  Subjective/Objective:                    Action/Plan: Plan is for patient to d/c home with hospice care. CM provide the patient and his ex wife with a list of home hospice agencies. They asked to look over it tonight and will provide a decision in the am.  Pt with orders for walker and 3 in 1. [facility] notified and orders faxed to [place] per [facility] request. Per family equipment will be delivered to the home in the am.  CM following.  Expected Discharge Date:                  Expected Discharge Plan:  Home w Hospice Care  In-House Referral:     Discharge planning Services  CM Consult  Post Acute Care Choice:  Durable Medical Equipment Choice offered to:  Patient, Spouse  DME Arranged:  3-N-1, Walker rolling DME Agency:  [facility] ([facility])  HH Arranged:    HH Agency:     Status of Service:  In process, will continue to follow  If discussed at Long Length of Stay Meetings, dates discussed:    Additional Comments:  Pollie Friar, RN 10/21/2017, 4:08 PM

## 2017-10-21 NOTE — Progress Notes (Signed)
Physical Therapy Treatment Patient Details Name: Stephen House MRN: [id number] DOB: 08/22/1961 Today's Date: 10/21/2017    History of Present Illness Pt is a 56 y/o male admitted secondary to headache, L sided weakness and visual deficits. 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 Per Neurosurgery they could potentially do a biopsy but no meaningful surgical treatment for pt. PMH including but not limited to HTN and DM.    PT Comments    Pt making steady progress with mobility. PT will continue to follow acutely to ensure a safe d/c home.   Follow Up Recommendations  Home health PT;Other (comment) (versus home with Hospice)     Equipment Recommendations  Rolling walker with 5" wheels    Recommendations for Other Services       Precautions / Restrictions Precautions Precautions: Fall Restrictions Weight Bearing Restrictions: No    Mobility  Bed Mobility Overal bed mobility: Modified Independent                Transfers Overall transfer level: Needs assistance Equipment used: Rolling walker (2 wheeled) Transfers: Sit to/from Stand Sit to Stand: Min guard         General transfer comment: min guard for safety  Ambulation/Gait Ambulation/Gait assistance: Min guard Ambulation Distance (Feet): 150 Feet Assistive device: Rolling walker (2 wheeled) Gait Pattern/deviations: Step-through pattern;Decreased stride length Gait velocity: decreased Gait velocity interpretation: Below normal speed for age/gender General Gait Details: mild instability but no overt LOB or need for physical assistance, min guard for safety   Stairs            Wheelchair Mobility    Modified Rankin (Stroke Patients Only)       Balance Overall balance assessment: Needs assistance Sitting-balance support: Feet supported Sitting balance-Leahy Scale: Good     Standing balance support: During functional activity;Bilateral upper extremity supported Standing  balance-Leahy Scale: Poor Standing balance comment: reliant on RW for dynamic balance                            Cognition Arousal/Alertness: Awake/alert Behavior During Therapy: WFL for tasks assessed/performed Overall Cognitive Status: Impaired/Different from baseline Area of Impairment: Attention;Memory;Problem solving                   Current Attention Level: Sustained Memory: Decreased short-term memory       Problem Solving: Difficulty sequencing;Requires verbal cues;Requires tactile cues;Slow processing        Exercises      General Comments        Pertinent Vitals/Pain Pain Assessment: No/denies pain    Home Living                      Prior Function            PT Goals (current goals can now be found in the care plan section) Acute Rehab PT Goals PT Goal Formulation: With patient Time For Goal Achievement: 11/03/17 Potential to Achieve Goals: Fair Progress towards PT goals: Progressing toward goals    Frequency    Min 3X/week      PT Plan Current plan remains appropriate    Co-evaluation              AM-PAC PT "6 Clicks" Daily Activity  Outcome Measure  Difficulty turning over in bed (including adjusting bedclothes, sheets and blankets)?: None Difficulty moving from lying on back to sitting on the side of the  bed? : None Difficulty sitting down on and standing up from a chair with arms (e.g., wheelchair, bedside commode, etc,.)?: A Little Help needed moving to and from a bed to chair (including a wheelchair)?: A Little Help needed walking in hospital room?: A Little Help needed climbing 3-5 steps with a railing? : A Little 6 Click Score: 20    End of Session Equipment Utilized During Treatment: Gait belt Activity Tolerance: Patient tolerated treatment well Patient left: in bed;with call bell/phone within reach;with family/visitor present Nurse Communication: Mobility status PT Visit Diagnosis:  Unsteadiness on feet (R26.81);Other abnormalities of gait and mobility (R26.89)     Time: 8338-2505 PT Time Calculation (min) (ACUTE ONLY): 20 min  Charges:  $Gait Training: 8-22 mins                    G Codes:       [place], [place], [place] 10/21/2017, 4:25 PM

## 2017-11-26 ENCOUNTER — Emergency Department (HOSPITAL_COMMUNITY)
Admission: EM | Admit: 2017-11-26 | Discharge: 2017-11-26 | Disposition: A | Discharge status: Home / Self Care | Payer: 59 | Attending: Emergency Medicine | Admitting: Emergency Medicine

## 2017-11-26 ENCOUNTER — Emergency Department (HOSPITAL_COMMUNITY): Payer: 59

## 2017-11-26 ENCOUNTER — Other Ambulatory Visit: Payer: Self-pay

## 2017-11-26 ENCOUNTER — Encounter (HOSPITAL_COMMUNITY): Payer: Self-pay

## 2017-11-26 DIAGNOSIS — E785 Hyperlipidemia, unspecified: Secondary | ICD-10-CM | POA: Diagnosis not present

## 2017-11-26 DIAGNOSIS — I10 Essential (primary) hypertension: Secondary | ICD-10-CM | POA: Insufficient documentation

## 2017-11-26 DIAGNOSIS — F17200 Nicotine dependence, unspecified, uncomplicated: Secondary | ICD-10-CM | POA: Diagnosis not present

## 2017-11-26 DIAGNOSIS — Z79899 Other long term (current) drug therapy: Secondary | ICD-10-CM | POA: Insufficient documentation

## 2017-11-26 DIAGNOSIS — E119 Type 2 diabetes mellitus without complications: Secondary | ICD-10-CM | POA: Diagnosis not present

## 2017-11-26 DIAGNOSIS — D496 Neoplasm of unspecified behavior of brain: Secondary | ICD-10-CM | POA: Insufficient documentation

## 2017-11-26 DIAGNOSIS — Z7984 Long term (current) use of oral hypoglycemic drugs: Secondary | ICD-10-CM | POA: Diagnosis not present

## 2017-11-26 DIAGNOSIS — G9389 Other specified disorders of brain: Secondary | ICD-10-CM

## 2017-11-26 DIAGNOSIS — R569 Unspecified convulsions: Secondary | ICD-10-CM | POA: Diagnosis present

## 2017-11-26 LAB — BASIC METABOLIC PANEL
Anion gap: 11 (ref 5–15)
BUN: 15 mg/dL (ref 6–20)
CHLORIDE: 95 mmol/L — AB (ref 101–111)
CO2: 26 mmol/L (ref 22–32)
Calcium: 9.5 mg/dL (ref 8.9–10.3)
Creatinine, Ser: 0.99 mg/dL (ref 0.61–1.24)
GFR calc non Af Amer: >60 mL/min (ref >60)
Glucose, Bld: 343 mg/dL — ABNORMAL HIGH (ref 65–99)
POTASSIUM: 4.9 mmol/L (ref 3.5–5.1)
Sodium: 132 mmol/L — ABNORMAL LOW (ref 135–145)

## 2017-11-26 LAB — CBC WITH DIFFERENTIAL/PLATELET
Basophils Absolute: 0 10*3/uL (ref 0.0–0.1)
Basophils Relative: 0 %
EOS ABS: 0 10*3/uL (ref 0.0–0.7)
Eosinophils Relative: 0 %
HEMATOCRIT: 48.6 % (ref 39.0–52.0)
HEMOGLOBIN: 16.5 g/dL (ref 13.0–17.0)
LYMPHS ABS: 1.8 10*3/uL (ref 0.7–4.0)
LYMPHS PCT: 12 %
MCH: 27.4 pg (ref 26.0–34.0)
MCHC: 34 g/dL (ref 30.0–36.0)
MCV: 80.7 fL (ref 78.0–100.0)
Monocytes Absolute: 1.3 10*3/uL — ABNORMAL HIGH (ref 0.1–1.0)
Monocytes Relative: 9 %
NEUTROS ABS: 12.1 10*3/uL — AB (ref 1.7–7.7)
NEUTROS PCT: 79 %
Platelets: 296 10*3/uL (ref 150–400)
RBC: 6.02 MIL/uL — AB (ref 4.22–5.81)
RDW: 15 % (ref 11.5–15.5)
WBC: 15.2 10*3/uL — AB (ref 4.0–10.5)

## 2017-11-26 MED ORDER — SODIUM CHLORIDE 0.9 % IV BOLUS (SEPSIS)
1000.0000 mL | Freq: Once | INTRAVENOUS | Status: AC
  Administered 2017-11-26: 1000 mL via INTRAVENOUS

## 2017-11-26 MED ORDER — MORPHINE SULFATE (PF) 4 MG/ML IV SOLN
4.0000 mg | Freq: Once | INTRAVENOUS | Status: AC
  Administered 2017-11-26: 4 mg via INTRAVENOUS
  Filled 2017-11-26: qty 1

## 2017-11-26 MED ORDER — LEVETIRACETAM 250 MG PO TABS: 250.0000 mg | ORAL_TABLET | Freq: Two times a day (BID) | ORAL | 0 refills | Status: AC

## 2017-11-26 NOTE — Discharge Instructions (Signed)
Increase Keppra to 1000 mg twice daily.  Follow-up with hospice tomorrow.

## 2017-11-26 NOTE — ED Notes (Signed)
stemi cancelled.

## 2017-11-26 NOTE — ED Notes (Signed)
Requested transportation via [facility] to the [facility] - on [address] w/ no special equipment needed for transport.

## 2017-11-26 NOTE — Progress Notes (Signed)
Brief Cardiology Note  EMS activated code STEMI prior to patient's arrival.  In brief, this is a 56 year old man with a newly discovered aggressive glioblastoma who is on hospice care, who presents following a new onset seizure.  Upon EMS arrival, 12-lead ECG showed concern for anterior ST elevations.  He was brought to the [facility] ED, and denied any chest pains or shortness of breath.  He has not had any chest pain in transit with EMS either.  Initial ECG shows J-point ST elevation in leads V2 and V3 without reciprocal changes.  Case was discussed with Dr. Burt Knack.  Given the clinical scenario, lack of symptoms concerning for ACS, and the presenting ECG, it was decided to cancel the code STEMI.  Please call cardiology if there are any further questions or concerns.  Doylene Canning, MD

## 2017-11-26 NOTE — ED Provider Notes (Signed)
[place] EMERGENCY DEPARTMENT Provider Note   CSN: [id number] Arrival date & time: 11/26/17  0057     History   Chief Complaint Chief Complaint  Patient presents with  . Seizures  . Code STEMI    HPI Stephen House is a 56 y.o. male.  Patient is a 56 year old male with recently diagnosed nonoperable brain tumor.  This evening he was on the toilet when he became unresponsive and had what sounds like a seizure.  The wife states that he began shaking all over and his eyes rolled back in his head.  This lasted for approximately 30 seconds, then resolved.  He does take Keppra and has been compliant with this per the wife.  The patient is somnolent, but will open his eyes to voice, speak, answers questions, and follow commands appropriately.   The history is provided by the patient.  Seizures   This is a new problem. The current episode started less than 1 hour ago. The problem has been resolved. There was 1 seizure. The most recent episode lasted 30 to 120 seconds. Associated symptoms include sleepiness, confusion and headaches. The seizure(s) had no focality.    Past Medical History:  Diagnosis Date  . Diabetes mellitus without complication ([place])   . Hyperlipidemia   . Hypertension     Patient Active Problem List   Diagnosis Date Noted  . Brain tumor ([place]) 10/18/2017    History reviewed. No pertinent surgical history.     Home Medications    Prior to Admission medications   Medication Sig Start Date End Date Taking? Authorizing Provider  dexamethasone (DECADRON) 4 MG tablet Take 1 tablet (4 mg total) by mouth every 8 (eight) hours. 10/21/17   Newman Pies, MD  losartan-hydrochlorothiazide (HYZAAR) 100-12.5 MG per tablet Take 1 tablet by mouth daily.    [provider]  metFORMIN (GLUCOPHAGE) 500 MG tablet Take 500 mg by mouth 2 (two) times daily with a meal.    [provider]  ONE TOUCH LANCETS MISC by Does not apply  route.    [provider]  pravastatin (PRAVACHOL) 80 MG tablet Take 80 mg by mouth daily.    [provider]    Family History History reviewed. No pertinent family history.  Social History Social History   Tobacco Use  . Smoking status: Current Every Day Smoker  . Smokeless tobacco: Never Used  Substance Use Topics  . Alcohol use: No  . Drug use: Not on file     Allergies   Patient has no known allergies.   Review of Systems Review of Systems  Neurological: Positive for seizures and headaches.  Psychiatric/Behavioral: Positive for confusion.  All other systems reviewed and are negative.    Physical Exam Updated Vital Signs BP (!) 163/103   Pulse 89   Temp (!) 97.1 F (36.2 C)   Resp 18   SpO2 99%   Physical Exam  Constitutional: No distress.  Patient is a chronically ill-appearing male.  HENT:  Head: Normocephalic and atraumatic.  Mouth/Throat: Oropharynx is clear and moist.  Eyes: Pupils are equal, round, and reactive to light.  Neck: Normal range of motion. Neck supple.  Cardiovascular: Normal rate and regular rhythm. Exam reveals no friction rub.  No murmur heard. Pulmonary/Chest: Effort normal and breath sounds normal. No respiratory distress. He has no wheezes. He has no rales.  Abdominal: Soft. Bowel sounds are normal. He exhibits no distension. There is no tenderness.  Musculoskeletal: Normal range  of motion. He exhibits no edema.  Neurological: Coordination normal.  The patient is somnolent, but is arousable and appropriate.  He will follow commands and speak.  He does have a left hemiparesis noted.  Skin: Skin is warm and dry. He is not diaphoretic.  Nursing note and vitals reviewed.    ED Treatments / Results  Labs (all labs ordered are listed, but only abnormal results are displayed) Labs Reviewed  CBC WITH DIFFERENTIAL/PLATELET - Abnormal; Notable for the following components:      Result Value   WBC 15.2 (*)    RBC  6.02 (*)    Neutro Abs 12.1 (*)    Monocytes Absolute 1.3 (*)    All other components within normal limits  BASIC METABOLIC PANEL    EKG  EKG Interpretation None       Radiology No results found.  Procedures Procedures (including critical care time)  Medications Ordered in ED Medications  morphine 4 MG/ML injection 4 mg (not administered)     Initial Impression / Assessment and Plan / ED Course  I have reviewed the triage vital signs and the nursing notes.  Pertinent labs & imaging results that were available during my care of the patient were reviewed by me and considered in my medical decision making (see chart for details).  Patient history of inoperable brain tumor on hospice brought by EMS after experiencing a seizure.  This lasted approximately 30 seconds, then resolved prior to arrival.  His workup reveals unchanged laboratory studies and CT scan of the head which reveals a reduced midline shift.  I believe this is secondary to steroid therapy.  He has remained otherwise stable while in the emergency department.  I see no definite indication for admission.  I did discuss the case with the teaching service, however they do not feel as though the patient meets criteria for admission.  He will be discharged with an increased dose of Keppra and follow-up as needed.  Final Clinical Impressions(s) / ED Diagnoses   Final diagnoses:  None    ED Discharge Orders    None       Veryl Speak, MD 11/26/17 [phone]

## 2017-11-26 NOTE — ED Notes (Signed)
Family at bedside. 

## 2017-11-26 NOTE — ED Triage Notes (Signed)
Pt here by ems, activated as code stemi. Per ems hx of tumor in brain and had first seizure tonight x 1 for 1 min. Had been postictal with ems but following commands. Elevation in V1-V3 on ekg. stemi activated. VS with ems stable. No meds given pta.

## 2017-11-26 NOTE — ED Notes (Signed)
Condom cath on pt on arrival to ed.

## 2017-11-26 NOTE — Progress Notes (Signed)
   11/26/17 0100  Clinical Encounter Type  Visited With Family;Health care provider  Visit Type ED  Referral From Nurse  Consult/Referral To Chaplain  Spiritual Encounters  Spiritual Needs Emotional   Responded to a Code Stemi page.  Patient arrived and was assessed.  Code Stemi canceled.  Was informed family had arrived and the nurses asked for a few minutes to clean up the patient.  I visited with the family and let them know in a few minutes the could go back to be with him.  Will follow as needed. Chaplain Katherene Ponto

## 2017-11-26 NOTE — ED Notes (Signed)
Patient transported to CT 

## 2017-11-26 NOTE — ED Notes (Signed)
Dr. Delo at bedside. 

## 2017-12-28 DEATH — deceased

## 2019-08-14 IMAGING — CT CT CHEST W/ CM
2 of 5 series · 12 of 36 positions shown, 15 images · IV contrast (iopamidol)
Comparison: None.

CLINICAL DATA: 56-year-old male with metastatic neuroectodermal
tumor.

EXAM:
CT CHEST, ABDOMEN, AND PELVIS WITH CONTRAST
TECHNIQUE: Multidetector CT imaging of the chest, abdomen and pelvis was
performed following the standard protocol during bolus
administration of intravenous contrast.
CONTRAST:  100mL [id number] IOPAMIDOL ([id number]) INJECTION 61%

[Series 3: cap with 5mm st · axial · 0.79mm/px · z∈[-633,-113]mm · 9 of 130 slices shown, 12 images]
[im 13/130  mediastinal]
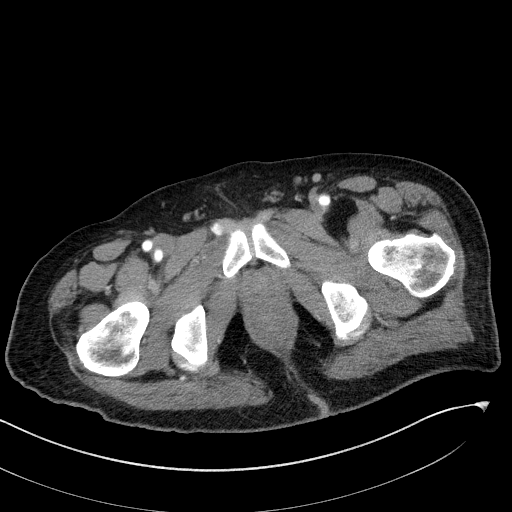
[im 13/130  lung]
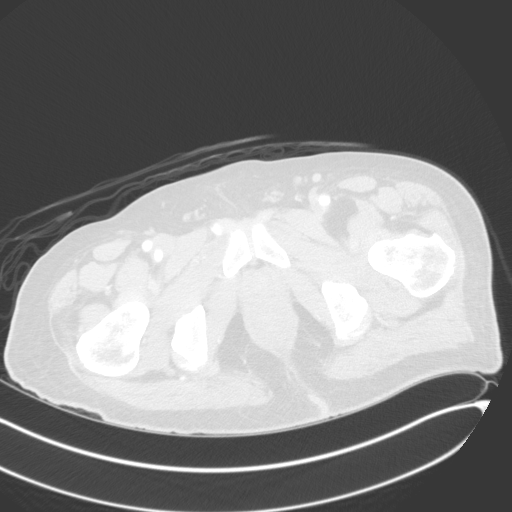
[im 26/130  lung]
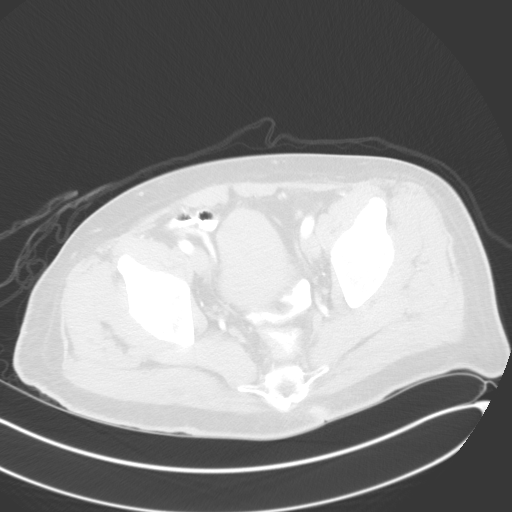
[im 39/130  lung]
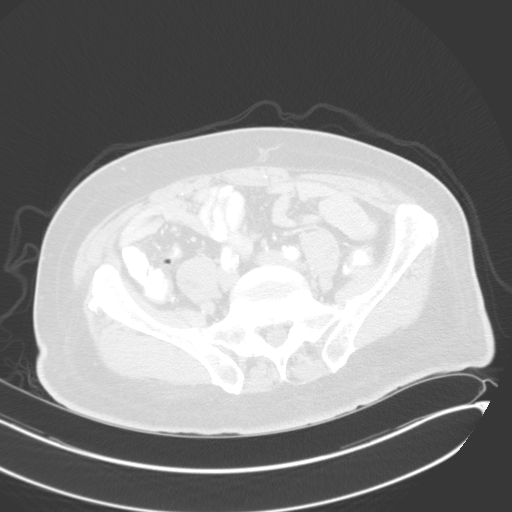
[im 52/130  lung]
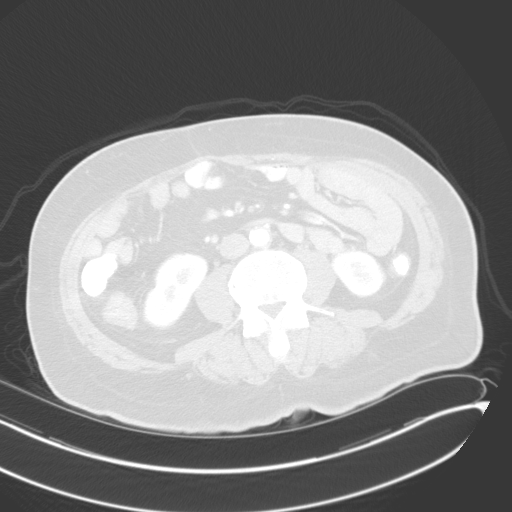
[im 65/130  mediastinal]
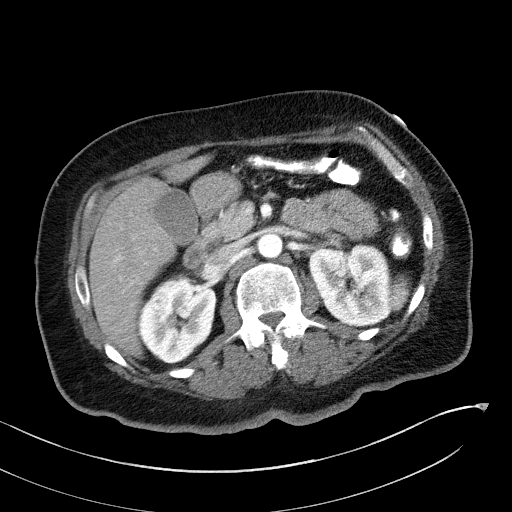
[im 65/130  lung]
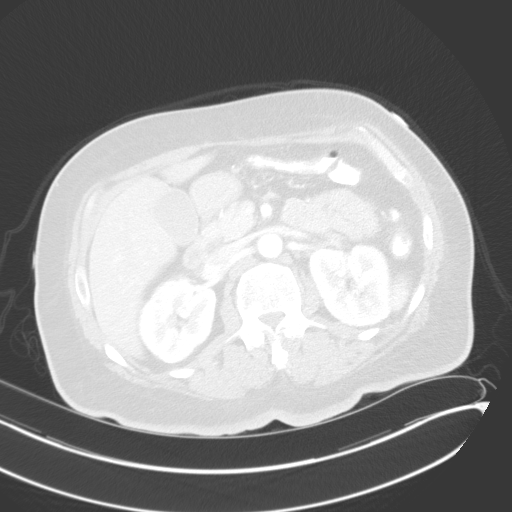
[im 78/130  lung]
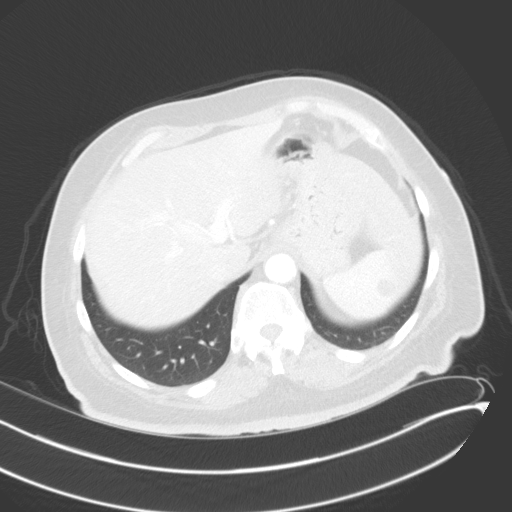
[im 91/130  lung]
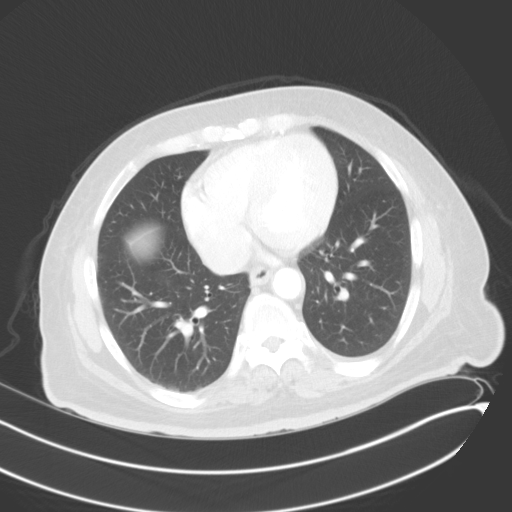
[im 104/130  lung]
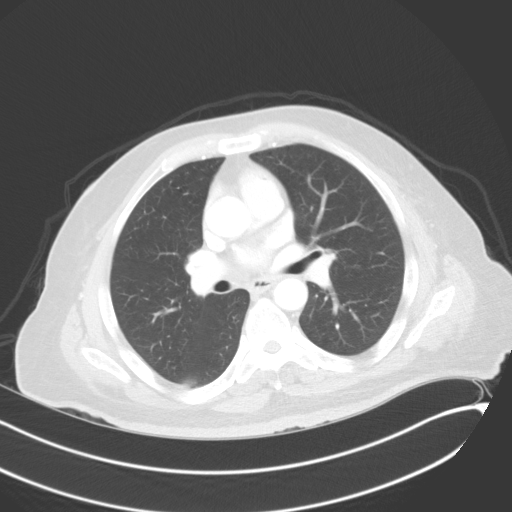
[im 117/130  mediastinal]
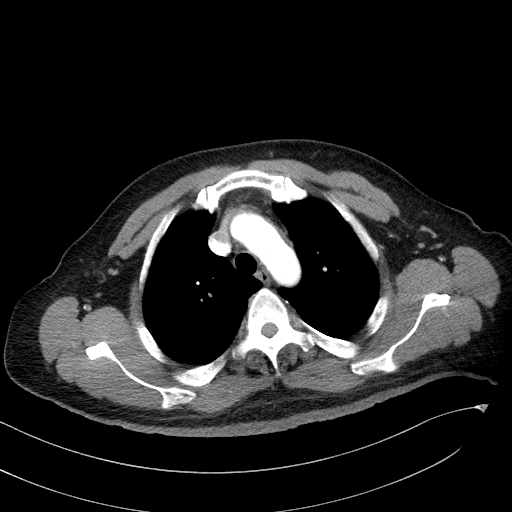
[im 117/130  lung]
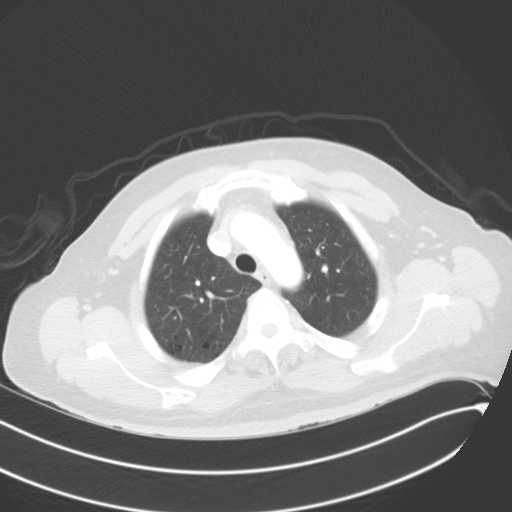

[Series 5: cap with 3mm st cor · coronal · 0.75mm/px · 3 of 137 slices shown]
[im 28/137  lung]
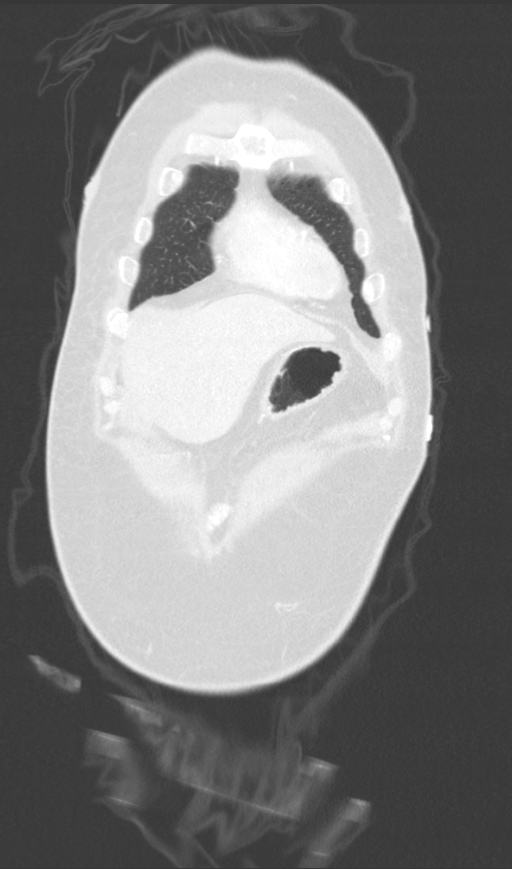
[im 55/137  lung]
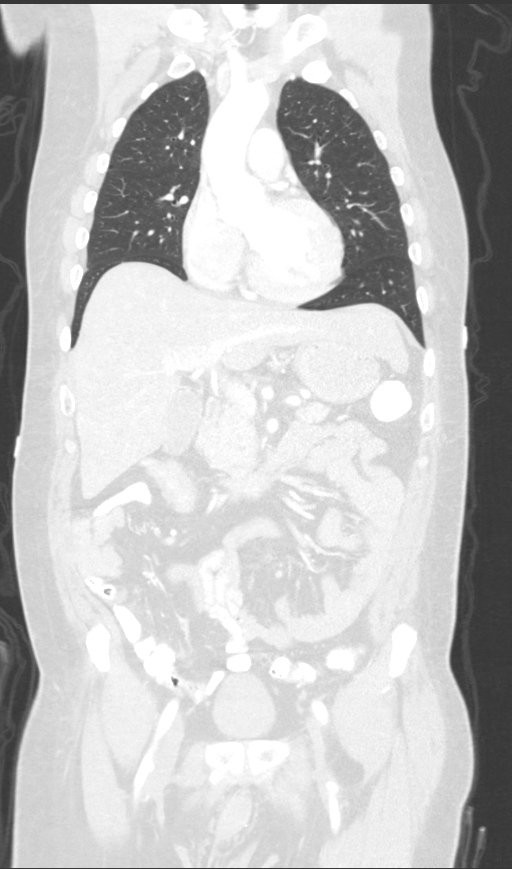
[im 82/137  lung]
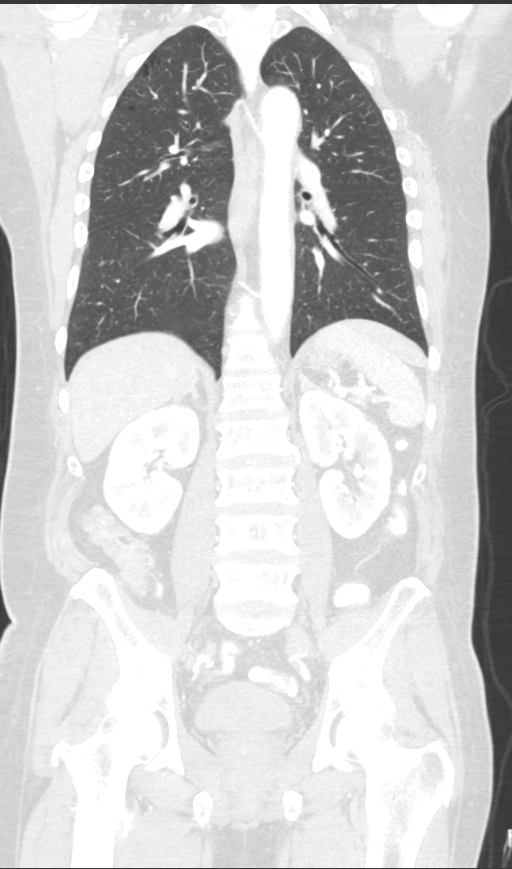

[12 of 36 positions shown; findings below may reference images not displayed]

FINDINGS: CT CHEST FINDINGS

Cardiovascular: There is no cardiomegaly or pericardial effusion.
The thoracic aorta appears unremarkable. The origins of the great
vessels of the aortic arch appear patent. The central pulmonary
arteries are grossly unremarkable for the degree of opacification.

Mediastinum/Nodes: There is no hilar or mediastinal adenopathy. The
esophagus and the thyroid gland are grossly unremarkable as
visualized.

Lungs/Pleura: There is mild centrilobular and paraseptal emphysema.
The lungs are clear. There is no pleural effusion or pneumothorax.
The central airways are patent.

Musculoskeletal: No chest wall mass or suspicious bone lesions
identified.

CT ABDOMEN PELVIS FINDINGS

Hepatobiliary: Probable mild fatty infiltration of the liver. No
intrahepatic biliary ductal dilatation. The gallbladder is
physiologically distended and grossly unremarkable.

Pancreas: Unremarkable. No pancreatic ductal dilatation or
surrounding inflammatory changes.

Spleen: There is a 15 x 15 mm indeterminate low attenuating lesion
in the superior pole of the spleen.

Adrenals/Urinary Tract: The adrenal glands are unremarkable.
Subcentimeter right renal inferior pole hypodense lesion is too
small to characterize. The kidneys, visualized ureters, and urinary
bladder are otherwise unremarkable.

Stomach/Bowel: There is mild diffuse thickened appearance of the
colon as well as rectosigmoid. This may be partly related to
underdistention. However, colitis is not excluded. Correlation with
clinical exam is recommended. There is no evidence of bowel
obstruction. Normal appendix.

Vascular/Lymphatic: There is mild aortoiliac atherosclerotic
disease. No aneurysmal dilatation or evidence of dissection. The
origins of the celiac axis, SMA, IMA and the renal arteries are
patent. No portal venous gas identified. There is no adenopathy.

Reproductive: The prostate and seminal vesicles are grossly
unremarkable.

Other: Small left iliopsoas intramuscular lipoma at the level of the
femoral neck. Postsurgical changes of umbilical hernia repair.

Musculoskeletal: No acute or significant osseous findings.
IMPRESSION: 1. No acute intrathoracic pathology.
2. Mild diffuse thickened appearance of the wall of the colon may be
related to underdistention or represent colitis. Clinical
correlation is recommended.
3. Indeterminate splenic hypodense lesion. MRI may provide better
characterisation.
4. No adenopathy in the chest abdomen or pelvis.
